# Patient Record
Sex: Male | Born: 1935 | Race: White | Marital: Married
Health system: Southern US, Community
[De-identification: ages and names within clinical notes are randomized; demographics above are authoritative.]

---

## 2013-08-03 ENCOUNTER — Other Ambulatory Visit: Payer: Self-pay | Admitting: Specialist

## 2013-08-03 DIAGNOSIS — R51 Headache: Principal | ICD-10-CM

## 2013-08-03 DIAGNOSIS — M549 Dorsalgia, unspecified: Secondary | ICD-10-CM

## 2013-08-03 DIAGNOSIS — M542 Cervicalgia: Secondary | ICD-10-CM

## 2013-08-03 DIAGNOSIS — R519 Headache, unspecified: Secondary | ICD-10-CM

## 2013-08-13 ENCOUNTER — Ambulatory Visit
Admission: RE | Admit: 2013-08-13 | Discharge: 2013-08-13 | Disposition: A | Discharge status: Home / Self Care | Payer: Medicare Other | Source: Ambulatory Visit | Attending: Specialist | Admitting: Specialist

## 2013-08-13 ENCOUNTER — Other Ambulatory Visit: Payer: Self-pay | Admitting: Specialist

## 2013-08-13 VITALS — BP 201/83 | HR 61

## 2013-08-13 DIAGNOSIS — R51 Headache: Principal | ICD-10-CM

## 2013-08-13 DIAGNOSIS — R519 Headache, unspecified: Secondary | ICD-10-CM

## 2013-08-13 MED ORDER — DIAZEPAM 5 MG PO TABS
5.0000 mg | ORAL_TABLET | Freq: Once | ORAL | Status: AC
  Administered 2013-08-13: 5 mg via ORAL

## 2013-08-13 MED ORDER — IOHEXOL 300 MG/ML  SOLN
9.0000 mL | Freq: Once | INTRAMUSCULAR | Status: AC | PRN
  Administered 2013-08-13: 9 mL via INTRATHECAL

## 2013-08-13 NOTE — Discharge Instructions (Signed)

## 2013-08-13 NOTE — Progress Notes (Signed)
Blood drawn from right AC to go with spinal fluid for tests. Site is unremarkable and pt tolerated procedure well. JKL RN

## 2013-08-14 LAB — CSF PANEL 1
Glucose, CSF: 62 mg/dL (ref 43–76)
RBC COUNT CSF: 0 uL
Total Protein, CSF: 89 mg/dL — ABNORMAL HIGH (ref 15–45)
Tube #: 3
WBC CSF: 1 uL (ref 0–5)

## 2013-08-15 LAB — BORRELIA SPECIES DNA, FLUID, PCR: BBURGDNAFLU: NOT DETECTED

## 2013-08-16 LAB — ANGIOTENSIN CONVERTING ENZYME, CSF: ACE, CSF: 7 U/L (ref <=15)

## 2013-08-17 LAB — CSF CULTURE W GRAM STAIN: Organism ID, Bacteria: NO GROWTH

## 2013-08-17 LAB — CSF CULTURE: GRAM STAIN: NONE SEEN

## 2013-09-10 LAB — FUNGUS CULTURE W SMEAR: Smear Result: NONE SEEN

## 2015-01-27 IMAGING — RF DG FLUORO GUIDE LUMBAR PUNCTURE
1 series · 1 of 1 positions shown · non-contrast
Comparison: none

CLINICAL DATA: Low CSF pressure headaches.

[Series 1: (hospital) · 1 of 1 slices shown]
[im 1/1]
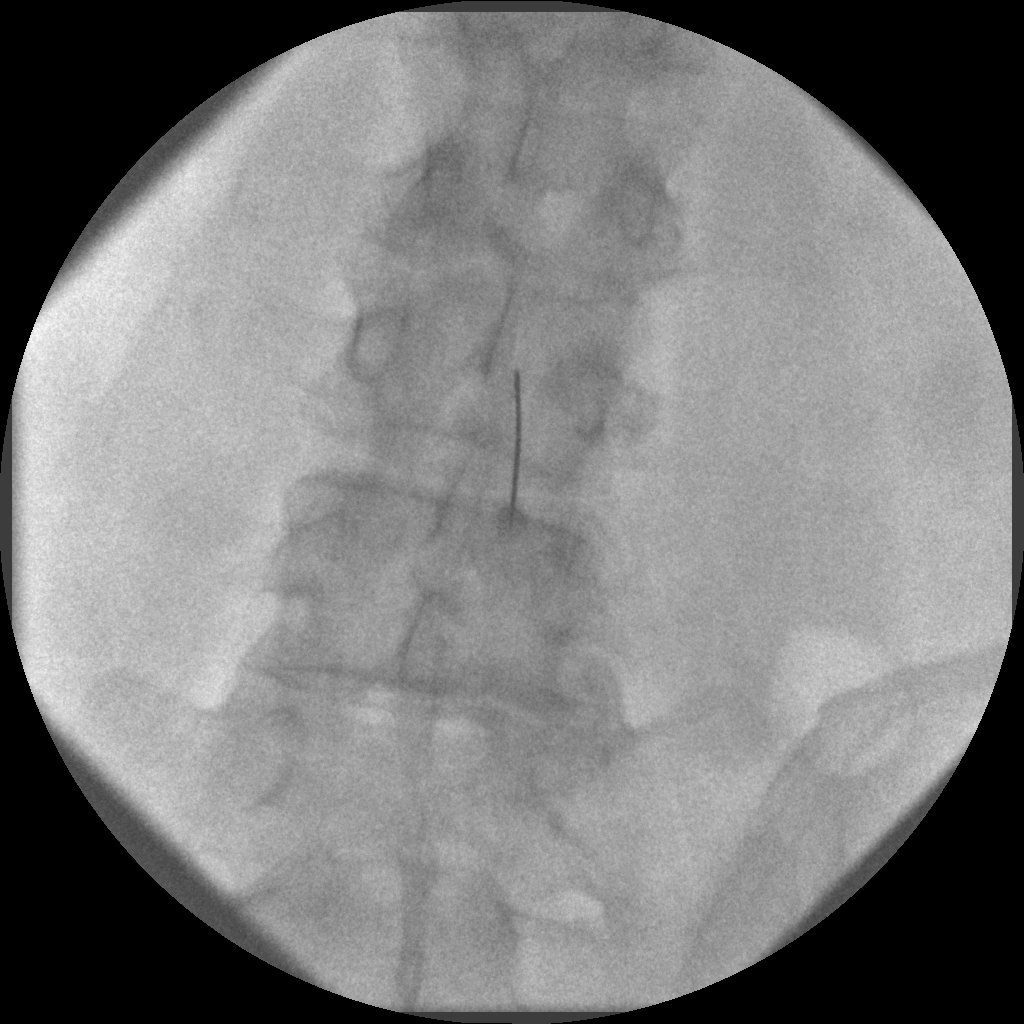

[1 of 1 positions shown; findings below may reference images not displayed]

EXAM:
DIAGNOSTIC LUMBAR PUNCTURE UNDER FLUOROSCOPIC GUIDANCE

FLUOROSCOPY TIME:  0 min 39 seconds

PROCEDURE:
Informed consent was obtained from the patient prior to the
procedure, including potential complications of headache, allergy,
and pain. With the patient prone, the lower back was prepped with
Betadine. 1% Lidocaine was used for local anesthesia. Lumbar
puncture was performed at the right L3-4 level using a 20 gauge
needle with return of clear CSF with an opening pressure of 13 cm
water. Opening pressure was measured prone and confirmed in the
lateral decubitus position. TenMl of CSF were obtained for
laboratory studies. Closing pressure was 8.5 cm water. The patient
tolerated the procedure well and there were no apparent
complications.
IMPRESSION: Opening pressure 13 cm water. Closing pressure 8.5 cm water after
removing 10 cc of spinal fluid.

## 2015-01-27 IMAGING — CT CT T SPINE W/ CM
2 of 3 series · 10 of 27 positions shown, 13 images · non-contrast
Comparison: none

CLINICAL DATA: Positional headache concerning for a CSF leak
headache.
TECHNIQUE: Contiguous axial images were obtained through the Cervical,
Thoracic, and Lumbar spine after the intrathecal infusion of
infusion. Coronal and sagittal reconstructions were obtained of the
axial image sets.

[Series 2: sacrum bone · axial · 0.31mm/px · z∈[-79,-29]mm · 5 of 31 slices shown, 7 images]
[im 6/31  soft-tissue]
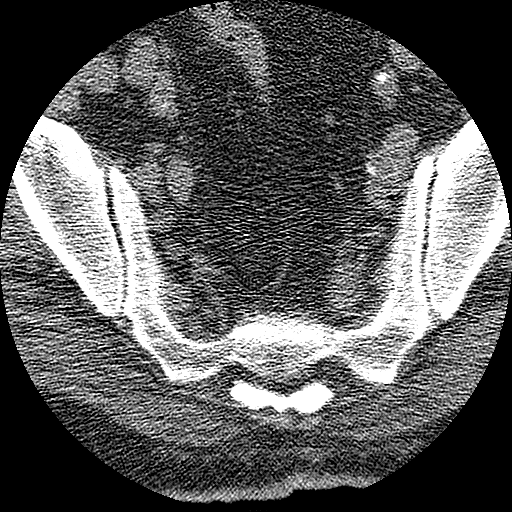
[im 6/31  bone]
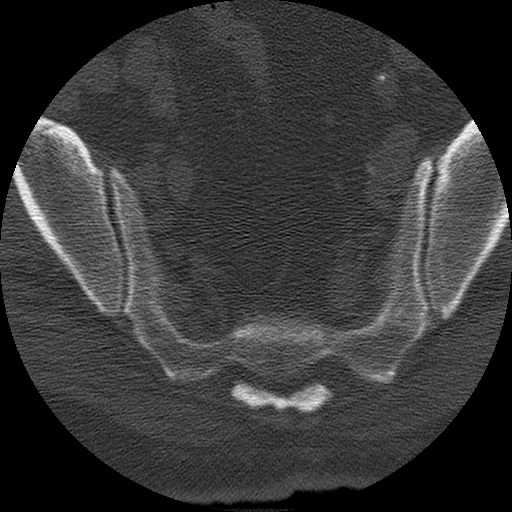
[im 11/31  bone]
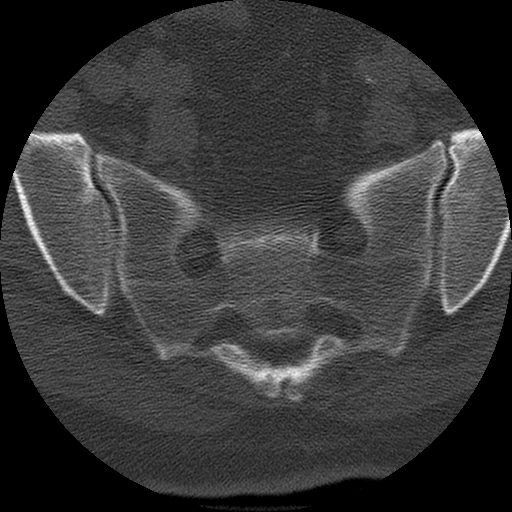
[im 16/31  bone]
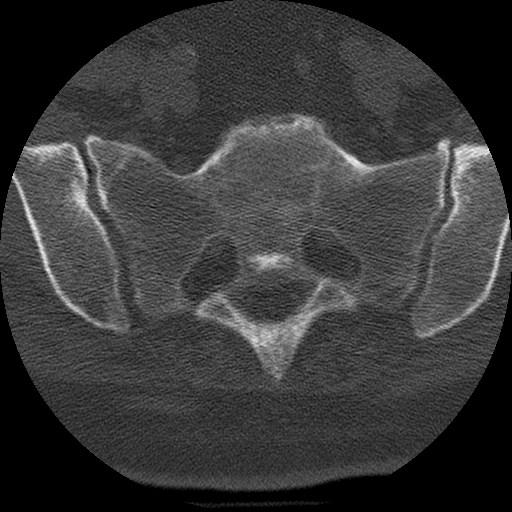
[im 21/31  bone]
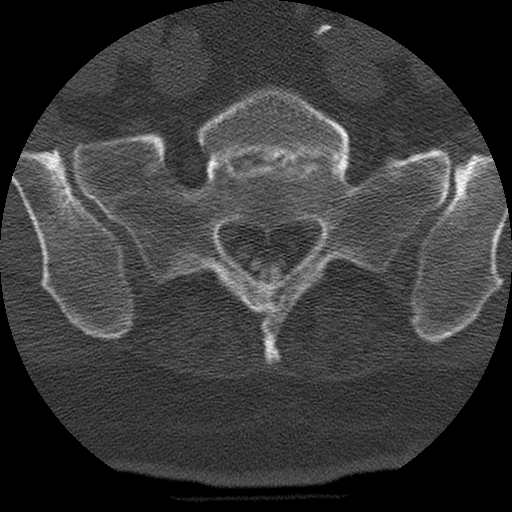
[im 26/31  soft-tissue]
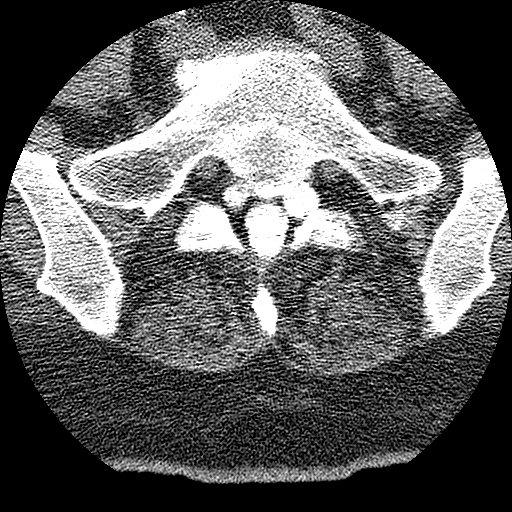
[im 26/31  bone]
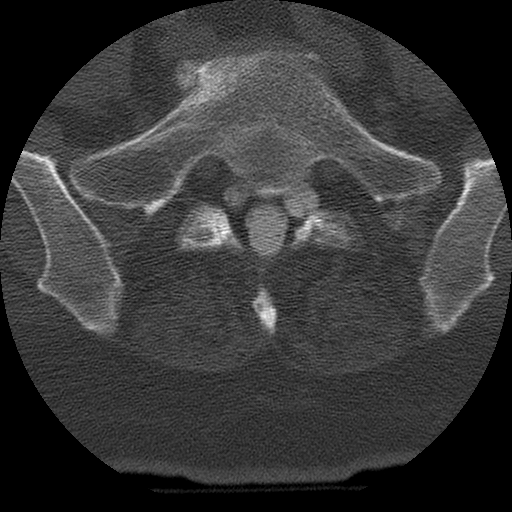

[Series 103: sag sacrum · sagittal · 0.31mm/px · 5 of 73 slices shown, 6 images]
[im 25/73  bone]
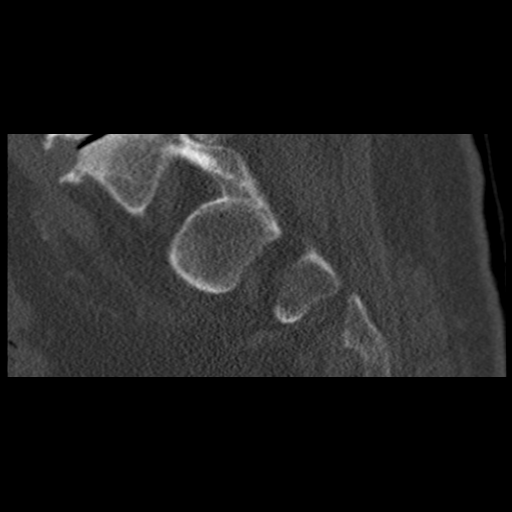
[im 31/73  bone]
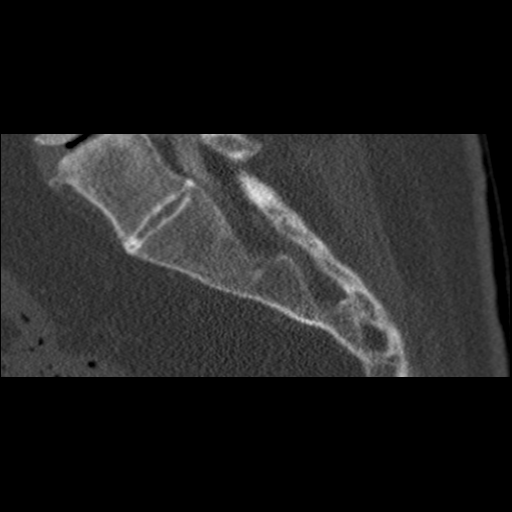
[im 37/73  soft-tissue]
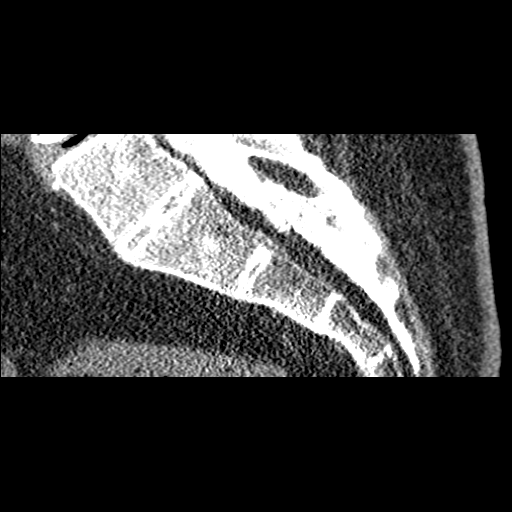
[im 37/73  bone]
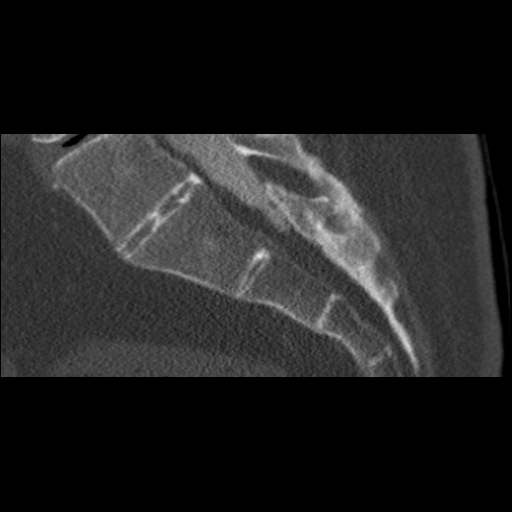
[im 43/73  bone]
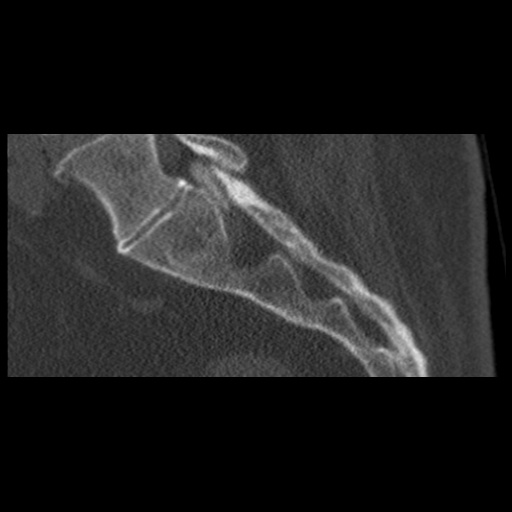
[im 49/73  bone]
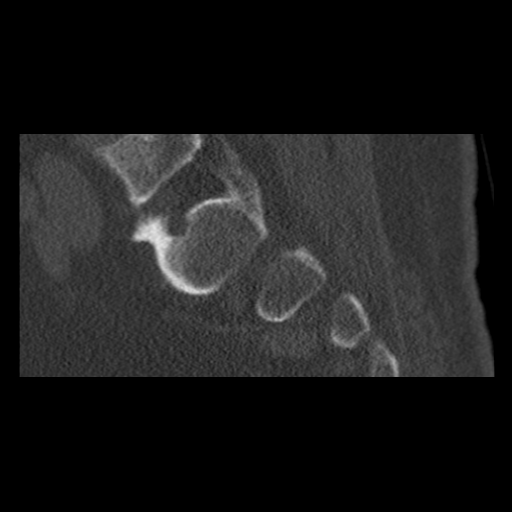

[10 of 27 positions shown; findings below may reference images not displayed]

FLUOROSCOPY TIME:  2 min 24 seconds

PROCEDURE:
LUMBAR PUNCTURE FOR CERVICAL LUMBAR AND THORACIC MYELOGRAM

CERVICAL AND LUMBAR AND THORACIC MYELOGRAM

CT CERVICAL MYELOGRAM

CT LUMBAR MYELOGRAM

CT THORACIC MYELOGRAM

After thorough discussion of risks and benefits of the procedure
including bleeding, infection, injury to nerves, blood vessels,
adjacent structures as well as headache and CSF leak, written and
oral informed consent was obtained. Consent was obtained by Dr. Nazareth
Makika.

Patient was positioned prone on the fluoroscopy table. Local
anesthesia was provided with 1% lidocaine without epinephrine after
prepped and draped in the usual sterile fashion. Puncture was
performed at L3-4 using a 3 1/2 inch 20 via right para median
approach. Using a single pass through the dura, the needle was
placed within the thecal sac, with return of clear CSF. 10 mL
1mnipaque-BFF was injected into the thecal sac, with normal
opacification of the nerve roots and cauda equina consistent with
free flow within the subarachnoid space. The patient was then moved
to the trendelenburg position and contrast flowed into the Thoracic
and Cervical spine regions.

I personally performed the lumbar puncture and administered the
intrathecal contrast. I also personally performed acquisition of the
myelogram images.
FINDINGS: CERVICAL, thoracic AND LUMBAR MYELOGRAM FINDINGS:

Lumbar: No abnormality seen at L1-2 or L2-3. L3-4 shows disc
degeneration with vacuum phenomenon more pronounced on the left.
Mild narrowing of the left lateral recess. L4-5 appears normal.
L5-S1 shows narrowing of the right lateral recess.

In evaluating the thecal sac and root sleeves for the possibility of
leak, note is made that the sacral root sleeves are patulous,
particularly the left S2 root sleeve. Additionally, contrast enters
the lower sacral root sleeves emanating from the tip of the thecal
sac. This finding is not normally seen. Initially, at myelography, I
thought that this represented the site of leak. However, please see
results of the CT portion of the examination.

Thoracic and cervical: No finding of a leak. CT is the more
important examination in this region. No central canal stenosis or
apparent nerve root compression.

CT CERVICAL MYELOGRAM FINDINGS:

There is no sign of CSF leak or abnormal root sleeve in the cervical
region.

Foramen magnum is widely patent. There is ordinary osteoarthritis of
the C1-2 articulation but no encroachment upon the neural spaces.

C2-3: Mild uncovertebral degeneration without stenosis of the canal
or foramina.

C3-4: Retrolisthesis of 2 mm. Degenerative spondylosis with endplate
osteophytes and bulging of the disc. No significant central canal
stenosis. Mild foraminal encroachment bilaterally by osteophytes.

C4-5: Degenerative spondylosis with endplate osteophytes and bulging
of the disc. Mild facet hypertrophy. Narrowing of the ventral
subarachnoid space but no compressive effect upon the cord. Mild
foraminal stenosis bilaterally, right more than left.

C5-6: Spondylosis with endplate osteophytes and bulging of the disc.
Mild facet degeneration bilaterally. Narrowing of the ventral
subarachnoid space but no compression of the cord. Mild foraminal
narrowing, left more than right.

C6-7: Degenerative spondylosis with endplate osteophytes and bulging
disc material. Narrowing of the ventral subarachnoid space but no
compression of the cord. Mild osteophytic encroachment upon both
neural foramina.

C7-T1: Facet degeneration left more than right. No canal or
foraminal stenosis.

CT LUMBAR MYELOGRAM FINDINGS:

Please note that the sacral region was re-examined a second time, 20
min after the initial scan, in hopes of demonstrating some change in
the appearance. No change was demonstrated however.

L1-2:  Normal interspace.

L2-3: Normal interspace. At the lateral aspect of L2, there is a
small meningocele that causes slight erosion of the mid pedicle. I
do not demonstrate any contrast leakage in association with this and
it is presumably incidental.

L3-4: Disc degeneration more pronounced on the left with loss of
disc height, vacuum phenomenon, endplate sclerosis and osteophyte
formation. There is mild facet hypertrophy on the left. There is
mild narrowing of the lateral recesses left more than right.

L4-5: Minimal bulging of the disc. Mild facet and ligamentous
hypertrophy. No compressive stenosis.

L5-S1: Chronic disc degeneration more pronounced on the right with
loss of disc height, vacuum phenomenon, endplate sclerosis and
osteophyte formation. There is mild narrowing of the right lateral
recess. There is mild foraminal narrowing on the right. No definite
neural compression.

The S1 segment has some transitional features. S1 root sleeves
appear within normal limits. Right S1 root sleeve is larger than the
left. The S2 root sleeves are quite prominent, particularly on the
left. However, I can not demonstrate any contrast leaking out of the
root sleeves into the epidural fatty tissues.

At the tip of the thecal sac, there is the unusual finding of root
sleeves with some of the more distal sacral nerve roots. At
myelography, I was concerned at that this could be a site of a leak.
However, at CT, the contrast appears confined to small root sleeves
and does not diffuse within the epidural space. Repeat scanning
after 20 min does not show a change. I think these findings argue
against active leaking at this moment.

Certainly, a person with this sacral root sleeve morphology may well
be leaking from these root sleeves, though we could not demonstrate
it at this moment in time.

There is mild osteoarthritis of the sacroiliac joints.

CT THORACIC MYELOGRAM FINDINGS:

There is minimal curvature in the thoracic region, not significant.
No antero or retrolisthesis. There is no significant degenerative
disc disease in the thoracic region. The T6-7 disc shows mild vacuum
phenomenon and annular bulging but there is no compressive stenosis.

There are no abnormal root sleeves in the thoracic region. No
evidence of contrast leak E age associated with any of the root
sleeves. No significant facet arthropathy. No fracture or other
focal finding.
IMPRESSION: No CSF leak is demonstrated on this examination. The patient does
have abnormal morphology of the sacral root sleeves with patulous
nerve root sleeves, particularly of the S2 root sleeves left more
than right and the somewhat unusual demonstration of root sleeves
associated with the more distal sacral nerve roots emanating from
the tip of the thecal sac. However, I do not believe there is an
active leak in these locations at this moment. I do believe that
persons with this morphology would be at higher risk of intermittent
leakage.

Small (4 mm) meningocele projecting laterally on the left at the L2
level with slight erosion of the medial pedicle. No sign of active
leaking in this location. This finding draws attention, but may well
be incidental.
